# Patient Record
Sex: Female | Born: 1995 | Race: Black or African American | Hispanic: No | Marital: Single | Smoking: Never smoker
Health system: Southern US, Community
[De-identification: ages and names within clinical notes are randomized; demographics above are authoritative.]

## PROBLEM LIST (undated history)

## (undated) DIAGNOSIS — J45909 Unspecified asthma, uncomplicated: Secondary | ICD-10-CM

---

## 2021-11-29 ENCOUNTER — Emergency Department (HOSPITAL_BASED_OUTPATIENT_CLINIC_OR_DEPARTMENT_OTHER): Payer: BC Managed Care – PPO

## 2021-11-29 ENCOUNTER — Encounter (HOSPITAL_BASED_OUTPATIENT_CLINIC_OR_DEPARTMENT_OTHER): Payer: Self-pay | Admitting: Emergency Medicine

## 2021-11-29 ENCOUNTER — Other Ambulatory Visit: Payer: Self-pay

## 2021-11-29 ENCOUNTER — Emergency Department (HOSPITAL_BASED_OUTPATIENT_CLINIC_OR_DEPARTMENT_OTHER)
Admission: EM | Admit: 2021-11-29 | Discharge: 2021-11-29 | Disposition: A | Discharge status: Home / Self Care | Payer: BC Managed Care – PPO | Attending: Emergency Medicine | Admitting: Emergency Medicine

## 2021-11-29 DIAGNOSIS — J101 Influenza due to other identified influenza virus with other respiratory manifestations: Secondary | ICD-10-CM | POA: Insufficient documentation

## 2021-11-29 DIAGNOSIS — R059 Cough, unspecified: Secondary | ICD-10-CM | POA: Diagnosis present

## 2021-11-29 DIAGNOSIS — Z20822 Contact with and (suspected) exposure to covid-19: Secondary | ICD-10-CM | POA: Diagnosis not present

## 2021-11-29 LAB — RESP PANEL BY RT-PCR (FLU A&B, COVID) ARPGX2
Influenza A by PCR: POSITIVE — AB
Influenza B by PCR: NEGATIVE
SARS Coronavirus 2 by RT PCR: NEGATIVE

## 2021-11-29 MED ORDER — ACETAMINOPHEN 325 MG PO TABS
650.0000 mg | ORAL_TABLET | Freq: Once | ORAL | Status: AC | PRN
  Administered 2021-11-29: 650 mg via ORAL
  Filled 2021-11-29: qty 2

## 2021-11-29 NOTE — ED Triage Notes (Signed)
Pt reports productive cough, body aches and HA. Has had symptoms for the past 3 days. Family members have similar symptoms. Did home Covid test that was negative.

## 2021-11-29 NOTE — Discharge Instructions (Signed)
You are seen in the emerge department today with flu.  Please remain at home and away from others for the next 5 to 7 days.  You may treat your symptoms with over-the-counter medications and return with any new or suddenly worsening symptoms.

## 2021-11-29 NOTE — ED Provider Notes (Signed)
Emergency Department Provider Note   I have reviewed the triage vital signs and the nursing notes.   HISTORY  Chief Complaint Cough and Generalized Body Aches   HPI Jessica Schroeder is a 26 y.o. female with no significant past medical history presents to the emergency department with cough, fever, body aches, and headache.  Symptoms of been developing over the past 3 days.  Her mom and other family members at home have similar symptoms.  She did a home COVID test which was negative.  She is having some headache.  She is having chest discomfort but only with coughing.  When she is not coughing she is not having any pain, shortness of breath, palpitations.    History reviewed. No pertinent past medical history.  Review of Systems  Constitutional: Positive fever/chills Eyes: No visual changes. ENT: Positive sore throat and congestion.  Cardiovascular: Positive chest pain w/ coughing only.  Respiratory: Denies shortness of breath. Positive cough.  Gastrointestinal: No abdominal pain.  No nausea, no vomiting.  No diarrhea.  No constipation. Genitourinary: Negative for dysuria. Musculoskeletal: Negative for back pain. Skin: Negative for rash. Neurological: Negative for headaches, focal weakness or numbness.  10-point ROS otherwise negative.  ____________________________________________   PHYSICAL EXAM:  VITAL SIGNS: ED Triage Vitals  Enc Vitals Group     BP 11/29/21 0819 119/85     Pulse Rate 11/29/21 0819 (!) 116     Resp 11/29/21 0819 16     Temp 11/29/21 0819 (!) 101.6 F (38.7 C)     Temp Source 11/29/21 0819 Oral     SpO2 11/29/21 0819 95 %   Constitutional: Alert and oriented. Well appearing and in no acute distress. Eyes: Conjunctivae are normal.  Head: Atraumatic. Nose: Positive congestion/rhinnorhea. Mouth/Throat: Mucous membranes are moist.  Oropharynx with mild erythema. No exudate.  Neck: No stridor.   Cardiovascular: Tachycardia. Good peripheral  circulation. Grossly normal heart sounds.   Respiratory: Normal respiratory effort.  No retractions. Lungs CTAB. Gastrointestinal: Soft and nontender. No distention.  Musculoskeletal: No gross deformities of extremities. Neurologic:  Normal speech and language.  Skin:  Skin is warm, dry and intact. No rash noted.  ____________________________________________   LABS (all labs ordered are listed, but only abnormal results are displayed)  Labs Reviewed  RESP PANEL BY RT-PCR (FLU A&B, COVID) ARPGX2 - Abnormal; Notable for the following components:      Result Value   Influenza A by PCR POSITIVE (*)    All other components within normal limits   ____________________________________________  RADIOLOGY  DG Chest Portable 1 View  Result Date: 11/29/2021 CLINICAL DATA:  Fever and cough for 3 days EXAM: PORTABLE CHEST 1 VIEW COMPARISON:  Portable exam 0840 hours without priors for comparison FINDINGS: Normal heart size, mediastinal contours, and pulmonary vascularity. Lungs clear. No pulmonary infiltrate, pleural effusion, or pneumothorax. Mild dextroconvex thoracic scoliosis. IMPRESSION: No acute abnormalities. Electronically Signed   By: Ulyses Southward M.D.   On: 11/29/2021 08:55    ____________________________________________   PROCEDURES  Procedure(s) performed:   Procedures  None  ____________________________________________   INITIAL IMPRESSION / ASSESSMENT AND PLAN / ED COURSE  Pertinent labs & imaging results that were available during my care of the patient were reviewed by me and considered in my medical decision making (see chart for details).   This patient is Presenting for Evaluation of flu-like illness, which does require a range of treatment options, and is a complaint that involves a moderate risk of morbidity and mortality.  The Differential Diagnoses include Flu, COVID, CAP, sepsis.  Critical Interventions- fever reduction, labs, and imaging.    Reassessment  after intervention: Fever reduced. Patient is well-appearing.    I did obtain Additional Historical Information from patient's mom.  I decided to review pertinent External Data, and in summary no recent ED visits.   Clinical Laboratory Tests Ordered, included COVID/Flu PCR. FLU A positive.   Radiologic Tests Ordered, included CXR which I independently evaluated. No infiltrate.  Social Determinants of Health Risk non-smoker. Lives with family.   Reevaluation with update and discussion with patient regarding flu diagnosis. Discussed risk/benefit and side effects of Tamiflu. Patient declines. Plan for supportive care and PCP follow up.   Medical Decision Making: Summary:  Patient presents to the ED with flu-like illness. No hypoxemia. No AMS. Lungs are CTABL. CXR and labs as above. Flu A Positive. Plan for supportive care and PCP follow up.   Disposition: discharge  ____________________________________________  FINAL CLINICAL IMPRESSION(S) / ED DIAGNOSES  Final diagnoses:  Influenza A    MEDICATIONS GIVEN DURING THIS VISIT:  Medications  acetaminophen (TYLENOL) tablet 650 mg (650 mg Oral Given 11/29/21 8182)    Note:  This document was prepared using Dragon voice recognition software and may include unintentional dictation errors.  Alona Bene, MD, [facility]    Scottlyn Mchaney, Arlyss Repress, MD 11/30/21 [phone]

## 2023-01-05 IMAGING — DX DG CHEST 1V PORT
1 series · 1 of 1 positions shown · non-contrast
Comparison: Portable exam 2372 hours without priors for comparison

CLINICAL DATA: Fever and cough for 3 days

EXAM:
PORTABLE CHEST 1 VIEW

[chest ap]
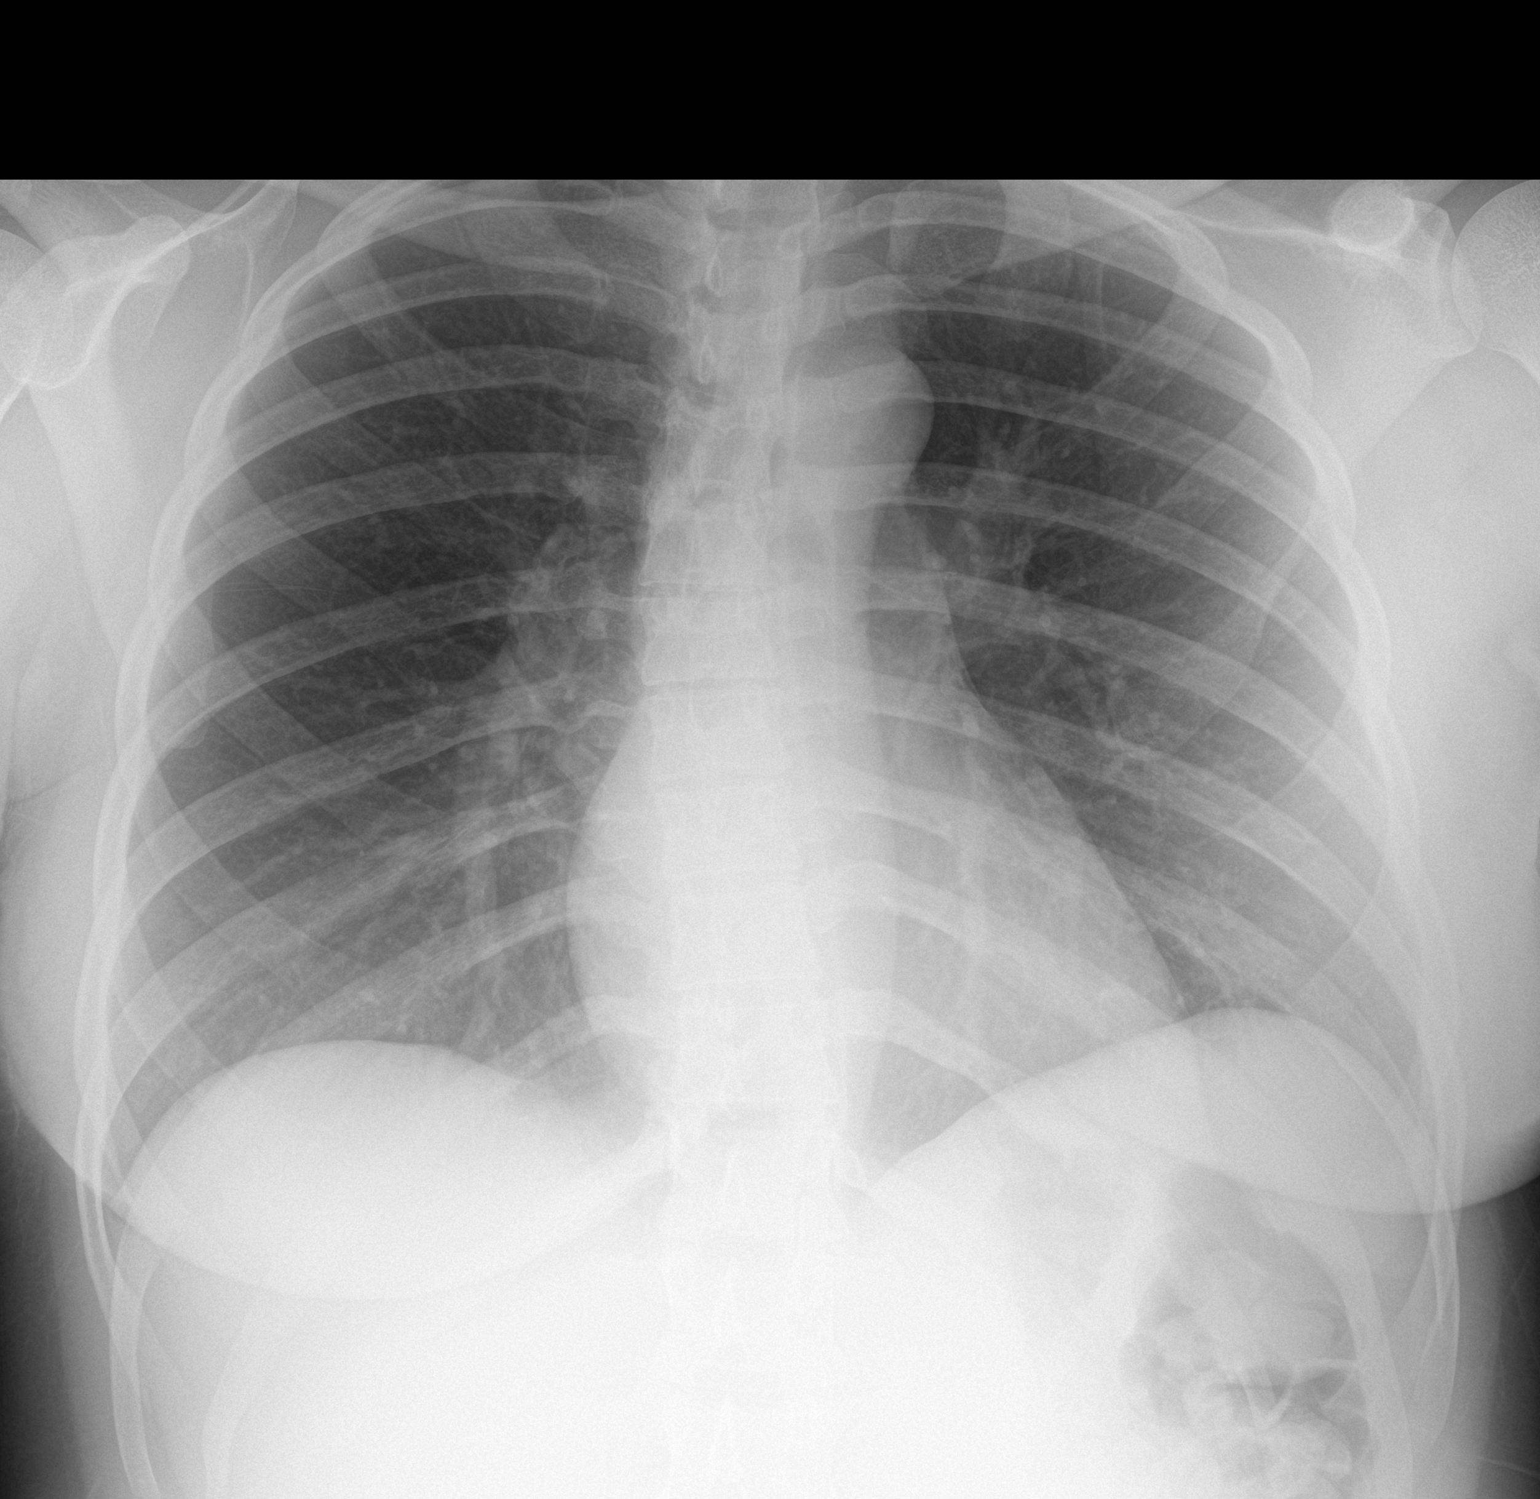

[1 of 1 positions shown; findings below may reference images not displayed]

FINDINGS: Normal heart size, mediastinal contours, and pulmonary vascularity.

Lungs clear.

No pulmonary infiltrate, pleural effusion, or pneumothorax.

Mild dextroconvex thoracic scoliosis.
IMPRESSION: No acute abnormalities.

## 2023-10-07 ENCOUNTER — Emergency Department (HOSPITAL_BASED_OUTPATIENT_CLINIC_OR_DEPARTMENT_OTHER)
Admission: EM | Admit: 2023-10-07 | Discharge: 2023-10-07 | Disposition: A | Discharge status: Home / Self Care | Payer: Self-pay | Attending: Emergency Medicine | Admitting: Emergency Medicine

## 2023-10-07 ENCOUNTER — Other Ambulatory Visit: Payer: Self-pay

## 2023-10-07 ENCOUNTER — Encounter (HOSPITAL_BASED_OUTPATIENT_CLINIC_OR_DEPARTMENT_OTHER): Payer: Self-pay | Admitting: Emergency Medicine

## 2023-10-07 ENCOUNTER — Other Ambulatory Visit (HOSPITAL_BASED_OUTPATIENT_CLINIC_OR_DEPARTMENT_OTHER): Payer: Self-pay

## 2023-10-07 ENCOUNTER — Emergency Department (HOSPITAL_BASED_OUTPATIENT_CLINIC_OR_DEPARTMENT_OTHER): Payer: Self-pay

## 2023-10-07 DIAGNOSIS — J189 Pneumonia, unspecified organism: Secondary | ICD-10-CM | POA: Insufficient documentation

## 2023-10-07 DIAGNOSIS — J45901 Unspecified asthma with (acute) exacerbation: Secondary | ICD-10-CM | POA: Insufficient documentation

## 2023-10-07 DIAGNOSIS — Z1152 Encounter for screening for COVID-19: Secondary | ICD-10-CM | POA: Insufficient documentation

## 2023-10-07 HISTORY — DX: Unspecified asthma, uncomplicated: J45.909

## 2023-10-07 LAB — RESP PANEL BY RT-PCR (RSV, FLU A&B, COVID)  RVPGX2
Influenza A by PCR: NEGATIVE
Influenza B by PCR: NEGATIVE
Resp Syncytial Virus by PCR: NEGATIVE
SARS Coronavirus 2 by RT PCR: NEGATIVE

## 2023-10-07 LAB — PREGNANCY, URINE: Preg Test, Ur: NEGATIVE

## 2023-10-07 MED ORDER — PREDNISONE 20 MG PO TABS
60.0000 mg | ORAL_TABLET | Freq: Every day | ORAL | 0 refills | Status: DC
  Filled 2023-10-07: qty 15, 5d supply, fill #0

## 2023-10-07 MED ORDER — DOXYCYCLINE HYCLATE 100 MG PO TABS
100.0000 mg | ORAL_TABLET | Freq: Once | ORAL | Status: AC
Start: 2023-10-07 — End: 2023-10-07
  Administered 2023-10-07: 100 mg via ORAL
  Filled 2023-10-07: qty 1

## 2023-10-07 MED ORDER — PREDNISONE 20 MG PO TABS
40.0000 mg | ORAL_TABLET | Freq: Every day | ORAL | 0 refills | Status: AC
  Filled 2023-10-07: qty 8, 4d supply, fill #0

## 2023-10-07 MED ORDER — DOXYCYCLINE HYCLATE 100 MG PO CAPS
100.0000 mg | ORAL_CAPSULE | Freq: Two times a day (BID) | ORAL | 0 refills | Status: AC
  Filled 2023-10-07: qty 20, 10d supply, fill #0

## 2023-10-07 MED ORDER — PREDNISONE 50 MG PO TABS
60.0000 mg | ORAL_TABLET | Freq: Once | ORAL | Status: AC
Start: 2023-10-07 — End: 2023-10-07
  Administered 2023-10-07: 60 mg via ORAL
  Filled 2023-10-07: qty 1

## 2023-10-07 MED ORDER — ALBUTEROL SULFATE HFA 108 (90 BASE) MCG/ACT IN AERS
2.0000 | INHALATION_SPRAY | Freq: Once | RESPIRATORY_TRACT | Status: AC
  Administered 2023-10-07: 2 via RESPIRATORY_TRACT
  Filled 2023-10-07: qty 6.7

## 2023-10-07 NOTE — ED Triage Notes (Signed)
Has had terrible cough  x 1 week and when she coughs her chest hurts  hasn't taken any med except one day and mucinex some sob

## 2023-10-07 NOTE — Discharge Instructions (Signed)
Take next dose of antibiotic tomorrow morning.  Take next dose of steroid tomorrow.  Return if symptoms worsen as discussed.  Use inhaler 2 to 4 puffs every 4-6 hours as needed.

## 2023-10-07 NOTE — ED Provider Notes (Signed)
[place] [facility] Provider Note   CSN: [id number] Arrival date & time: 10/07/23  1415     History  Chief Complaint  Patient presents with   Cough    Jessica Schroeder is a 27 y.o. female.  Patient here with cough congestion for the last 6 to 7 days.  Nothing makes it worse or better.  History of asthma.  Some fevers and chills at times.  Denies any weakness numbness tingling.  Denies any shortness of breath.  Denies any chest pain abdominal pain nausea vomiting diarrhea.  The history is provided by the patient.       Home Medications Prior to Admission medications   Medication Sig Start Date End Date Taking? Authorizing Provider  doxycycline (VIBRAMYCIN) 100 MG capsule Take 1 capsule (100 mg total) by mouth 2 (two) times daily. 10/07/23  Yes Adrielle Polakowski, DO  predniSONE (DELTASONE) 20 MG tablet Take 2 tablets (40 mg total) by mouth daily for 4 days. 10/07/23 10/11/23  Virgina Norfolk, DO      Allergies    Patient has no known allergies.    Review of Systems   Review of Systems  Physical Exam Updated Vital Signs BP (!) 132/93 (BP Location: Left Arm)   Pulse 100   Temp 99.8 F (37.7 C)   Resp 20   Ht 5\' 5"  (1.651 m)   Wt 86.2 kg   LMP 10/07/2023   SpO2 97%   BMI 31.62 kg/m  Physical Exam Vitals and nursing note reviewed.  Constitutional:      General: She is not in acute distress.    Appearance: She is well-developed. She is not ill-appearing.  HENT:     Head: Normocephalic and atraumatic.     Mouth/Throat:     Mouth: Mucous membranes are moist.  Eyes:     Extraocular Movements: Extraocular movements intact.     Conjunctiva/sclera: Conjunctivae normal.     Pupils: Pupils are equal, round, and reactive to light.  Cardiovascular:     Rate and Rhythm: Normal rate and regular rhythm.     Pulses: Normal pulses.     Heart sounds: Normal heart sounds. No murmur heard. Pulmonary:     Effort: Pulmonary effort is normal.  No respiratory distress.     Breath sounds: Wheezing present.  Abdominal:     Palpations: Abdomen is soft.     Tenderness: There is no abdominal tenderness.  Musculoskeletal:        General: No swelling.     Cervical back: Normal range of motion and neck supple.  Skin:    General: Skin is warm and dry.     Capillary Refill: Capillary refill takes less than 2 seconds.  Neurological:     General: No focal deficit present.     Mental Status: She is alert.  Psychiatric:        Mood and Affect: Mood normal.     ED Results / Procedures / Treatments   Labs (all labs ordered are listed, but only abnormal results are displayed) Labs Reviewed  RESP PANEL BY RT-PCR (RSV, FLU A&B, COVID)  RVPGX2  PREGNANCY, URINE    EKG EKG Interpretation Date/Time:  Monday October 07 2023 14:29:46 EST Ventricular Rate:  113 PR Interval:  134 QRS Duration:  76 QT Interval:  301 QTC Calculation: 413 R Axis:   62  Text Interpretation: Sinus tachycardia Ventricular premature complex Aberrant complex Confirmed by Virgina Norfolk [phone]) on 10/07/2023 3:16:42 PM  Radiology DG Chest 2 View  Result Date: 10/07/2023 CLINICAL DATA:  Cough for 1 week EXAM: CHEST - 2 VIEW COMPARISON:  11/29/2021 FINDINGS: Lingular and left upper lobe pneumonia. No pleural effusion. Normal cardiac size. No pneumothorax IMPRESSION: Lingular and left upper lobe pneumonia. Electronically Signed   By: Jasmine Pang M.D.   On: 10/07/2023 15:42    Procedures Procedures    Medications Ordered in ED Medications  albuterol (VENTOLIN HFA) 108 (90 Base) MCG/ACT inhaler 2 puff (2 puffs Inhalation Given 10/07/23 1615)  predniSONE (DELTASONE) tablet 60 mg (60 mg Oral Given 10/07/23 1618)  doxycycline (VIBRA-TABS) tablet 100 mg (100 mg Oral Given 10/07/23 1619)    ED Course/ Medical Decision Making/ A&P                                 Medical Decision Making Amount and/or Complexity of Data Reviewed Labs: ordered. Radiology:  ordered.  Risk Prescription drug management.   Jessica Schroeder is here for cough and congestion.  Low-grade fever but otherwise unremarkable vitals.  Little bit of wheezing on exam but she appears comfortable.  No increased work of breathing.  History of asthma.  Overall differential is viral process versus asthma exacerbation versus pneumonia.  She is already had chest x-ray COVID and flu test done prior to my evaluation.  COVID and flu test are negative.  Chest x-ray consistent with pneumonia.  Overall we will treat for asthma exacerbation in the setting of pneumonia.  Will give antibiotics, steroids and breathing treatment and albuterol for home.  She is otherwise healthy.  Have no concern for sepsis.  She understands strict return precautions.  Discharged in good condition.  This chart was dictated using voice recognition software.  Despite best efforts to proofread,  errors can occur which can change the documentation meaning.         Final Clinical Impression(s) / ED Diagnoses Final diagnoses:  Community acquired pneumonia, unspecified laterality  Mild asthma with exacerbation, unspecified whether persistent    Rx / DC Orders ED Discharge Orders          Ordered    doxycycline (VIBRAMYCIN) 100 MG capsule  2 times daily        10/07/23 1622    predniSONE (DELTASONE) 20 MG tablet  Daily,   Status:  Discontinued        10/07/23 1622    predniSONE (DELTASONE) 20 MG tablet  Daily        10/07/23 1622              Virgina Norfolk, DO 10/07/23 1625
